# Patient Record
Sex: Female | Born: 1995 | Race: White | Hispanic: No | Marital: Single | Smoking: Never smoker
Health system: Southern US, Community
[De-identification: ages and names within clinical notes are randomized; demographics above are authoritative.]

## PROBLEM LIST (undated history)

## (undated) DIAGNOSIS — L0291 Cutaneous abscess, unspecified: Secondary | ICD-10-CM

## (undated) HISTORY — PX: APPENDECTOMY: SHX54

## (undated) HISTORY — PX: NO PAST SURGERIES: SHX2092

---
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
## 2015-05-07 IMAGING — CT CT ABD-PELV W/ CM
2 of 4 series · 16 of 46 positions shown, 18 images · IV contrast (Omni 300)
Comparison: None.

CLINICAL DATA: Right lower quadrant pain.

EXAM:
CT ABDOMEN AND PELVIS WITH CONTRAST
TECHNIQUE: Multidetector CT imaging of the abdomen and pelvis was performed
using the standard protocol following bolus administration of
intravenous contrast.
CONTRAST:  100mL OMNIPAQUE IOHEXOL 300 MG/ML  SOLN

[Series 2: coronals · coronal · 0.70mm/px · 3 of 119 slices shown]
[im 40/119  soft-tissue]
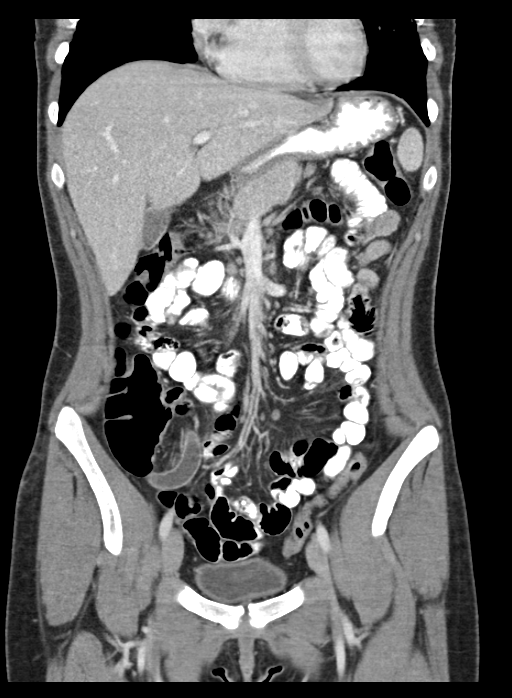
[im 53/119  soft-tissue]
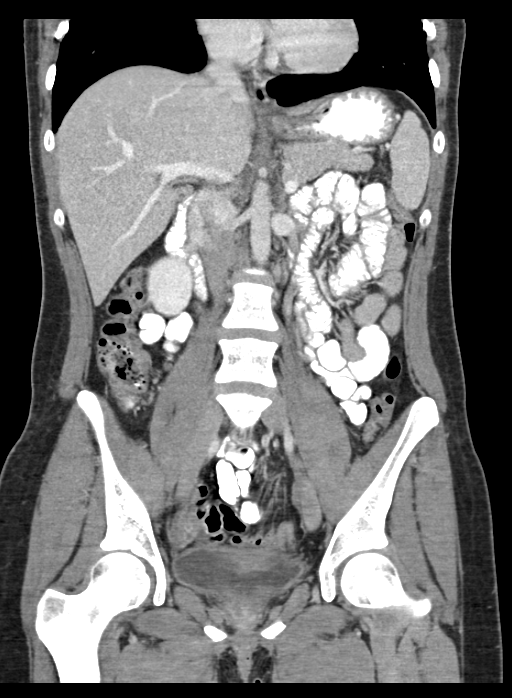
[im 66/119  soft-tissue]
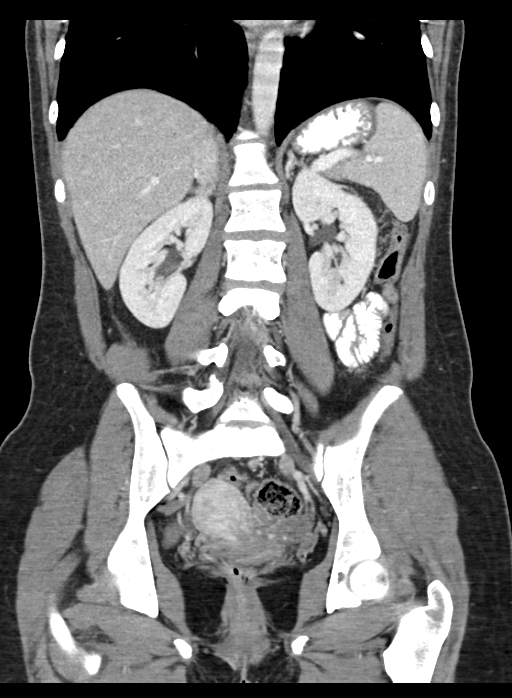

[Series 4: abd/ pelvis 5.0 i30f 1 · axial · 0.70mm/px · z∈[-454,-24]mm · 13 of 94 slices shown, 15 images]
[im 4/94  soft-tissue]
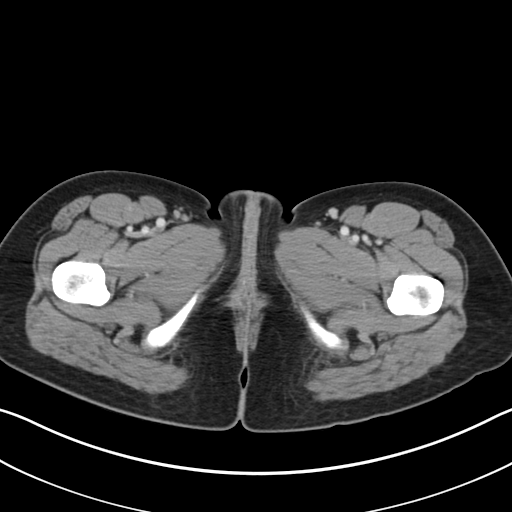
[im 4/94  bone]
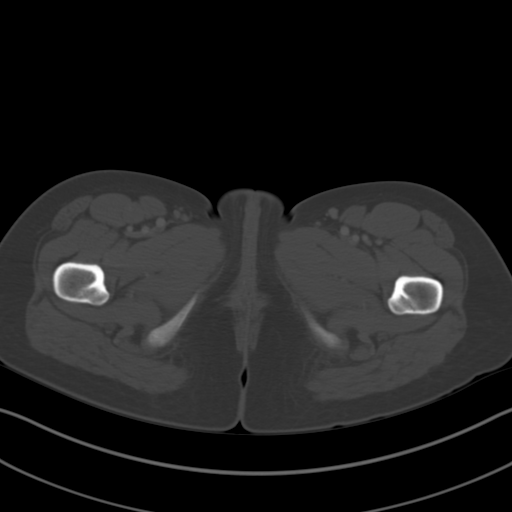
[im 12/94  soft-tissue]
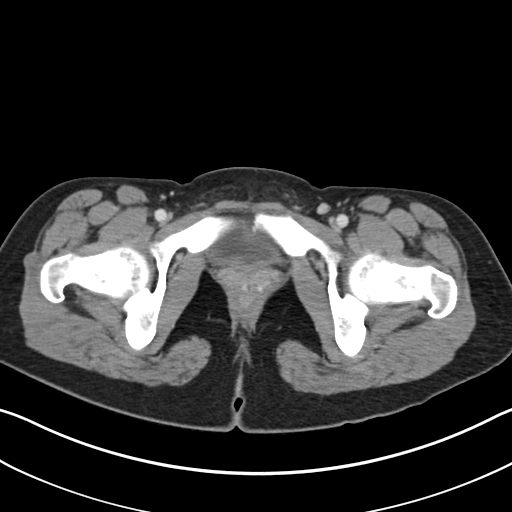
[im 20/94  soft-tissue]
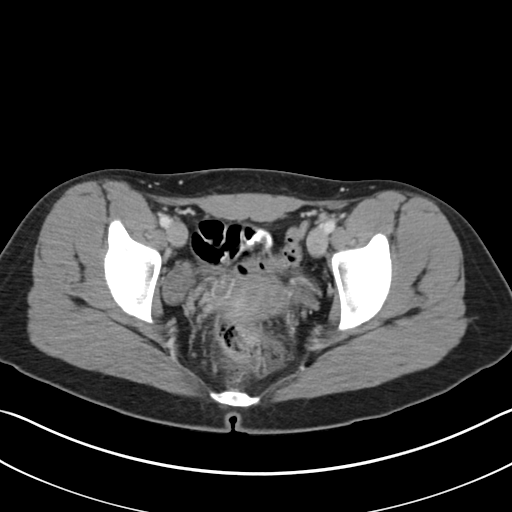
[im 28/94  soft-tissue]
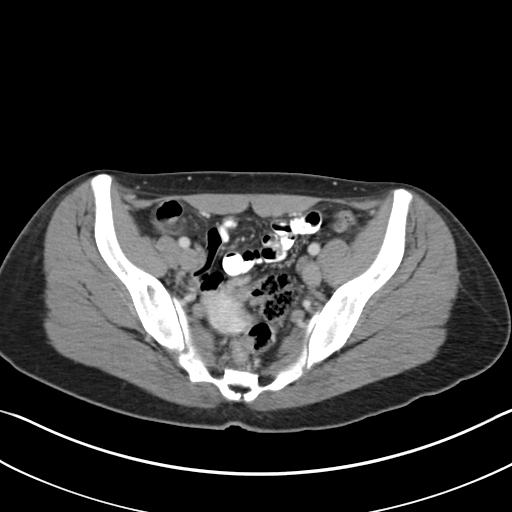
[im 32/94  soft-tissue]
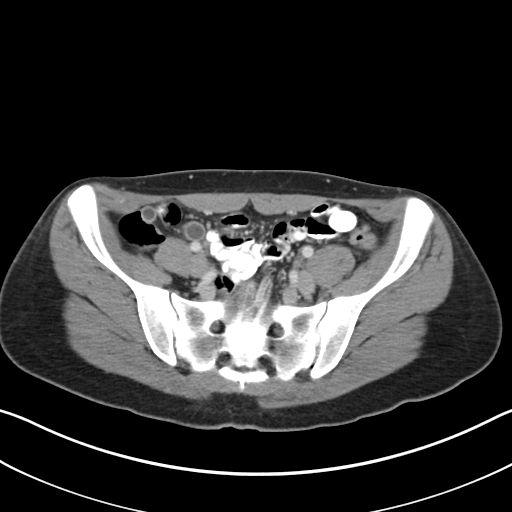
[im 39/94  soft-tissue]
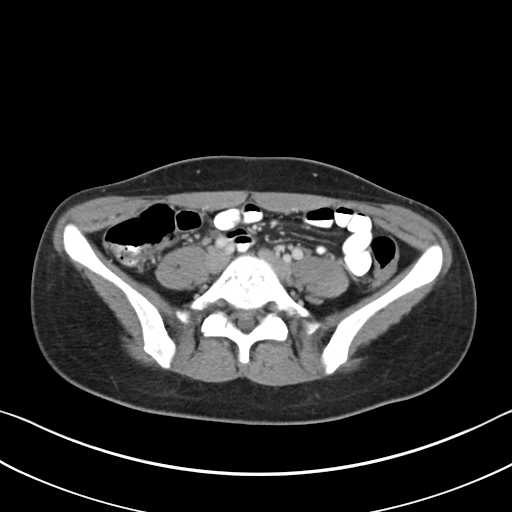
[im 47/94  soft-tissue]
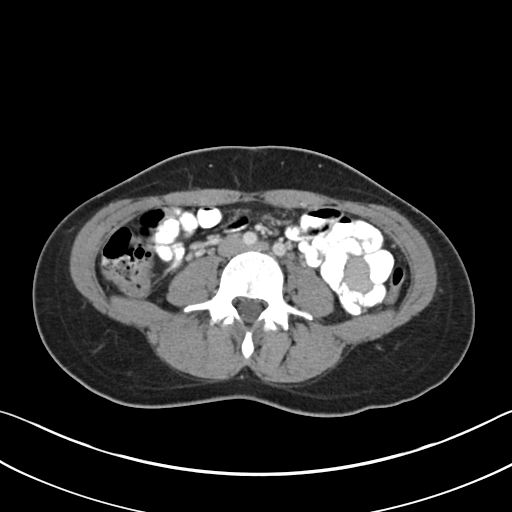
[im 55/94  soft-tissue]
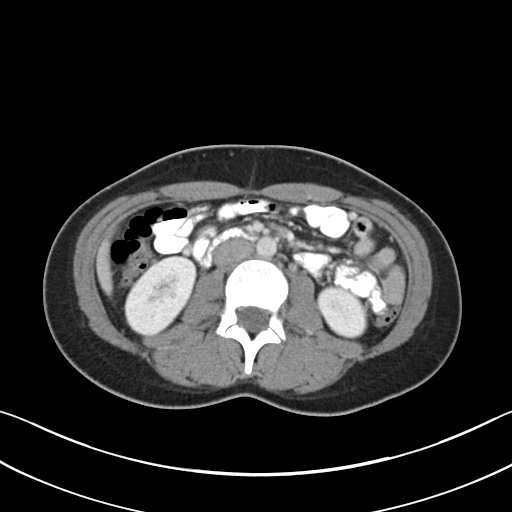
[im 63/94  soft-tissue]
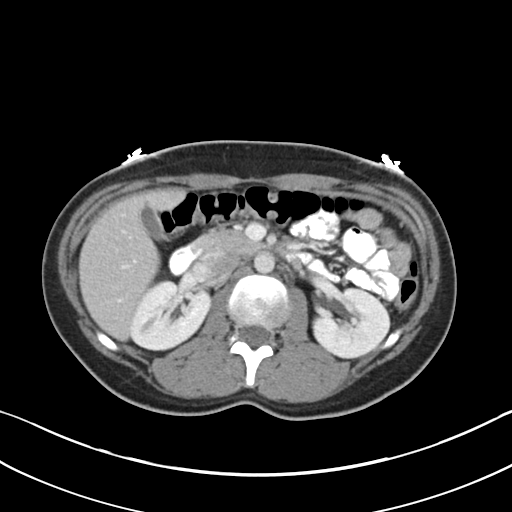
[im 63/94  bone]
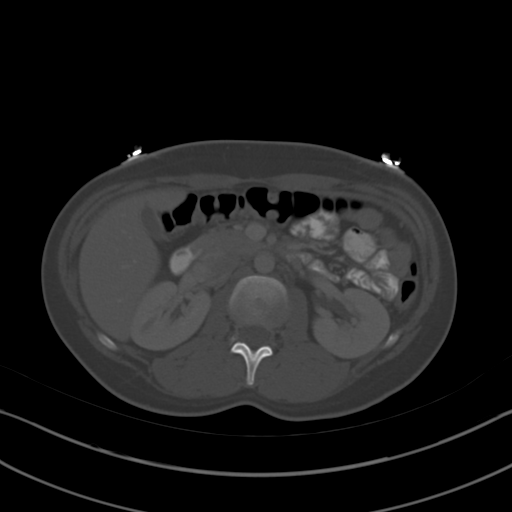
[im 66/94  soft-tissue]
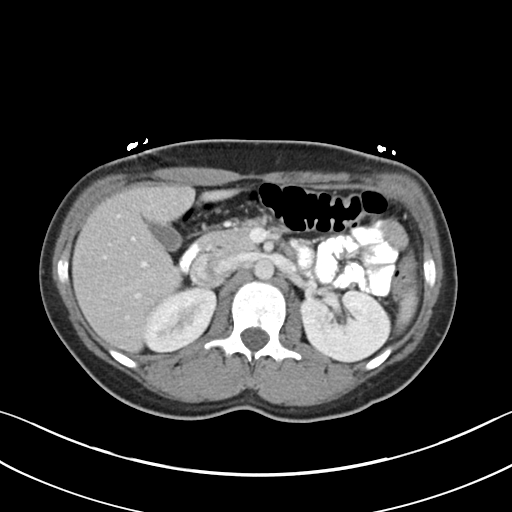
[im 74/94  soft-tissue]
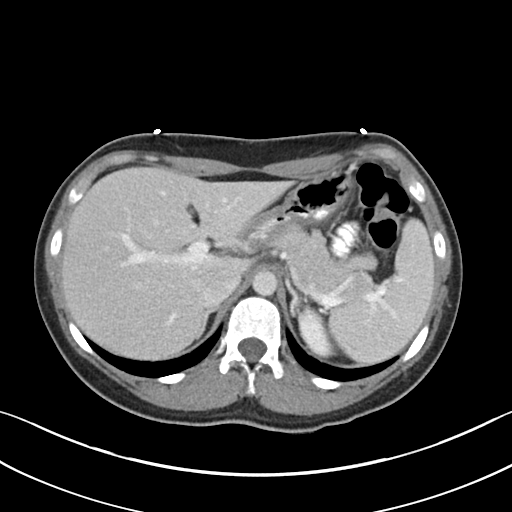
[im 82/94  soft-tissue]
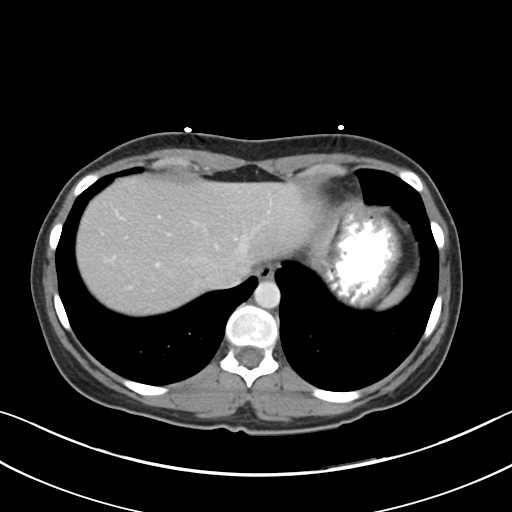
[im 90/94  soft-tissue]
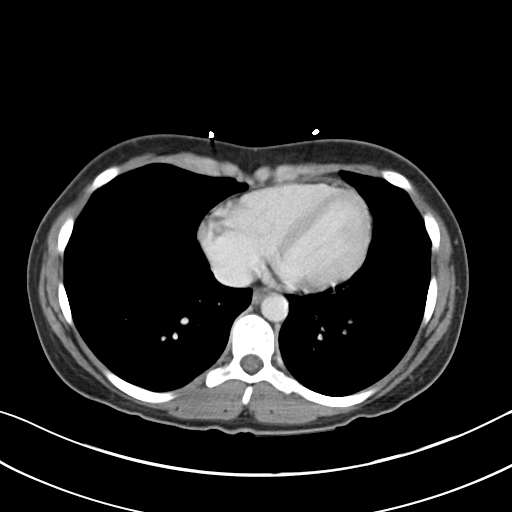

[16 of 46 positions shown; findings below may reference images not displayed]

FINDINGS: Lower chest: Lung bases show no acute findings. Heart size normal.
No pericardial or pleural effusion.

Hepatobiliary: Liver and gallbladder are unremarkable. No biliary
ductal dilatation.

Pancreas: Negative.

Spleen: Negative.

Adrenals/Urinary Tract: Adrenal glands and kidneys are unremarkable.
Ureters are decompressed. Bladder is unremarkable.

Stomach/Bowel: Stomach and small bowel are unremarkable. Appendix is
fluid-filled and dilated, measuring up to 1.4 cm. The wall is not
thickened and there are no periappendiceal inflammatory changes.
Colon is unremarkable.

Vascular/Lymphatic: Vascular structures are unremarkable. Ileocolic
mesenteric lymph nodes measure up to 8 mm in short axis.

Reproductive: Uterus and ovaries are visualized.

Other: No free fluid. Mesenteries and peritoneum are otherwise
unremarkable.

Musculoskeletal: No worrisome lytic or sclerotic lesions.
IMPRESSION: Dilated, fluid-filled and thin-walled appendix, without
periappendiceal inflammatory changes. Findings may be due to
early/mild appendicitis. Mucocele cannot be excluded.

## 2020-10-31 ENCOUNTER — Ambulatory Visit
Admission: RE | Admit: 2020-10-31 | Discharge: 2020-10-31 | Disposition: A | Discharge status: Home / Self Care | Payer: Medicaid Other | Source: Ambulatory Visit | Attending: Internal Medicine | Admitting: Internal Medicine

## 2020-10-31 ENCOUNTER — Other Ambulatory Visit: Payer: Self-pay

## 2020-10-31 VITALS — BP 128/84 | HR 76 | Temp 97.9°F | Resp 18

## 2020-10-31 DIAGNOSIS — B029 Zoster without complications: Secondary | ICD-10-CM

## 2020-10-31 MED ORDER — VALACYCLOVIR HCL 1 G PO TABS: 1000.0000 mg | ORAL_TABLET | Freq: Three times a day (TID) | ORAL | 0 refills | Status: AC

## 2020-10-31 NOTE — Discharge Instructions (Signed)
Please take medications as prescribed If you have any redness of your eye, or eye pain please return to urgent care to be reevaluated.

## 2020-10-31 NOTE — ED Triage Notes (Signed)
Onset yesterday of pruritic lesion on forehead. Has tried witch hazel with some relief. Pt reports that her boyfriend HPV and is concerned if the lesion is similar.

## 2020-10-31 NOTE — ED Provider Notes (Signed)
[facility]    CSN: [id number] Arrival date & time: 10/31/20  1242      History   Chief Complaint Chief Complaint  Patient presents with   1pm appointment    facial lesion    forehead    HPI Erin Walter is a 25 y.o. female comes to the urgent care with achy rash on the left frontal scalp.  Patient's symptoms started yesterday.  It initially started as an achy pain over the left forehead followed by rash with some burning and itching sensation.  She is observed a few more rashes in the same area.  Patient had chickenpox as a child.  No febrile episodes.  No nausea or vomiting.  No redness of her eyes.  No eye discharge or redness. Marland Kitchen   HPI  Past Medical History:  Diagnosis Date   Abscess     Patient Active Problem List   Diagnosis Date Noted   S/P laparoscopic appendectomy Dec 2015 01/14/2014   Irregular menses 10/02/2011   Bilateral lower abdominal pain 09/30/2011    Past Surgical History:  Procedure Laterality Date   APPENDECTOMY     LAPAROSCOPIC APPENDECTOMY N/A 01/13/2014   Procedure: APPENDECTOMY LAPAROSCOPIC;  Surgeon: Violeta Gelinas, MD;  Location: [facility] OR;  Service: General;  Laterality: N/A;   NO PAST SURGERIES      OB History   No obstetric history on file.      Home Medications    Prior to Admission medications   Medication Sig Start Date End Date Taking? Authorizing Provider  valACYclovir (VALTREX) 1000 MG tablet Take 1 tablet (1,000 mg total) by mouth 3 (three) times daily for 7 days. 10/31/20 11/07/20 Yes Harinder Romas, Britta Mccreedy, MD  HAILEY 24 FE 1-20 MG-MCG(24) tablet Take 1 tablet by mouth daily. 09/18/20   [provider]    Family History Family History  Problem Relation Age of Onset   Cholelithiasis Maternal Grandmother     Social History Social History   Tobacco Use   Smoking status: Never   Smokeless tobacco: Never  Substance Use Topics   Alcohol use: No   Drug use: Yes    Types: Marijuana     Allergies    Azithromycin and Omeprazole   Review of Systems Review of Systems  Constitutional: Negative.   Eyes:  Positive for redness. Negative for photophobia, pain, discharge, itching and visual disturbance.  Respiratory: Negative.    Gastrointestinal: Negative.   Genitourinary: Negative.   Neurological: Negative.     Physical Exam Triage Vital Signs ED Triage Vitals  Enc Vitals Group     BP 10/31/20 1303 128/84     Pulse Rate 10/31/20 1303 76     Resp 10/31/20 1303 18     Temp 10/31/20 1303 97.9 F (36.6 C)     Temp Source 10/31/20 1303 Oral     SpO2 10/31/20 1303 98 %     Weight --      Height --      Head Circumference --      Peak Flow --      Pain Score 10/31/20 1305 0     Pain Loc --      Pain Edu? --      Excl. in GC? --    No data found.  Updated Vital Signs BP 128/84 (BP Location: Right Arm)   Pulse 76   Temp 97.9 F (36.6 C) (Oral)   Resp 18   LMP 10/26/2020 (Exact Date)   SpO2  98%   Visual Acuity Right Eye Distance:   Left Eye Distance:   Bilateral Distance:    Right Eye Near:   Left Eye Near:    Bilateral Near:     Physical Exam Skin:    General: Skin is warm.     Findings: Rash present.     Comments: Nonerythematous vesicular rash over the left side of the forehead.  Few vesicles noted.     UC Treatments / Results  Labs (all labs ordered are listed, but only abnormal results are displayed) Labs Reviewed - No data to display  EKG   Radiology No results found.  Procedures Procedures (including critical care time)  Medications Ordered in UC Medications - No data to display  Initial Impression / Assessment and Plan / UC Course  I have reviewed the triage vital signs and the nursing notes.  Pertinent labs & imaging results that were available during my care of the patient were reviewed by me and considered in my medical decision making (see chart for details).     1.  Herpes zoster infection: Valacyclovir 1000 mg 3 times daily for  7 days If you have any eye symptoms please return to urgent care immediately Over-the-counter anti-itch cream  Final Clinical Impressions(s) / UC Diagnoses   Final diagnoses:  Herpes zoster without complication     Discharge Instructions      Please take medications as prescribed If you have any redness of your eye, or eye pain please return to urgent care to be reevaluated.   ED Prescriptions     Medication Sig Dispense Auth. Provider   valACYclovir (VALTREX) 1000 MG tablet Take 1 tablet (1,000 mg total) by mouth 3 (three) times daily for 7 days. 21 tablet Fannie Gathright, Britta Mccreedy, MD      PDMP not reviewed this encounter.   Merrilee Jansky, MD 10/31/20 1401

## 2023-10-03 ENCOUNTER — Other Ambulatory Visit: Payer: Self-pay

## 2023-10-03 ENCOUNTER — Emergency Department (HOSPITAL_COMMUNITY)
Admission: EM | Admit: 2023-10-03 | Discharge: 2023-10-03 | Disposition: A | Discharge status: Home / Self Care | Attending: Emergency Medicine | Admitting: Emergency Medicine

## 2023-10-03 ENCOUNTER — Encounter (HOSPITAL_COMMUNITY): Payer: Self-pay | Admitting: *Deleted

## 2023-10-03 DIAGNOSIS — D72829 Elevated white blood cell count, unspecified: Secondary | ICD-10-CM | POA: Insufficient documentation

## 2023-10-03 DIAGNOSIS — R112 Nausea with vomiting, unspecified: Secondary | ICD-10-CM | POA: Diagnosis present

## 2023-10-03 DIAGNOSIS — K529 Noninfective gastroenteritis and colitis, unspecified: Secondary | ICD-10-CM | POA: Insufficient documentation

## 2023-10-03 LAB — I-STAT CHEM 8, ED
BUN: 13 mg/dL (ref 6–20)
Calcium, Ion: 1.12 mmol/L — ABNORMAL LOW (ref 1.15–1.40)
Chloride: 106 mmol/L (ref 98–111)
Creatinine, Ser: 0.9 mg/dL (ref 0.44–1.00)
Glucose, Bld: 121 mg/dL — ABNORMAL HIGH (ref 70–99)
HCT: 45 % (ref 36.0–46.0)
Hemoglobin: 15.3 g/dL — ABNORMAL HIGH (ref 12.0–15.0)
Potassium: 3.7 mmol/L (ref 3.5–5.1)
Sodium: 140 mmol/L (ref 135–145)
TCO2: 20 mmol/L — ABNORMAL LOW (ref 22–32)

## 2023-10-03 LAB — CBC
HCT: 45 % (ref 36.0–46.0)
Hemoglobin: 14.8 g/dL (ref 12.0–15.0)
MCH: 30 pg (ref 26.0–34.0)
MCHC: 32.9 g/dL (ref 30.0–36.0)
MCV: 91.1 fL (ref 80.0–100.0)
Platelets: 235 K/uL (ref 150–400)
RBC: 4.94 MIL/uL (ref 3.87–5.11)
RDW: 12.1 % (ref 11.5–15.5)
WBC: 11.7 K/uL — ABNORMAL HIGH (ref 4.0–10.5)
nRBC: 0 % (ref 0.0–0.2)

## 2023-10-03 LAB — URINALYSIS, ROUTINE W REFLEX MICROSCOPIC
Bilirubin Urine: NEGATIVE
Glucose, UA: NEGATIVE mg/dL
Hgb urine dipstick: NEGATIVE
Ketones, ur: 5 mg/dL — AB
Leukocytes,Ua: NEGATIVE
Nitrite: NEGATIVE
Protein, ur: NEGATIVE mg/dL
Specific Gravity, Urine: 1.018 (ref 1.005–1.030)
pH: 5 (ref 5.0–8.0)

## 2023-10-03 LAB — COMPREHENSIVE METABOLIC PANEL WITH GFR
ALT: 24 U/L (ref 0–44)
AST: 24 U/L (ref 15–41)
Albumin: 4.5 g/dL (ref 3.5–5.0)
Alkaline Phosphatase: 89 U/L (ref 38–126)
Anion gap: 15 (ref 5–15)
BUN: 13 mg/dL (ref 6–20)
CO2: 20 mmol/L — ABNORMAL LOW (ref 22–32)
Calcium: 9.9 mg/dL (ref 8.9–10.3)
Chloride: 103 mmol/L (ref 98–111)
Creatinine, Ser: 0.79 mg/dL (ref 0.44–1.00)
GFR, Estimated: >60 mL/min (ref >60)
Glucose, Bld: 117 mg/dL — ABNORMAL HIGH (ref 70–99)
Potassium: 3.7 mmol/L (ref 3.5–5.1)
Sodium: 138 mmol/L (ref 135–145)
Total Bilirubin: 1.2 mg/dL (ref 0.0–1.2)
Total Protein: 8 g/dL (ref 6.5–8.1)

## 2023-10-03 LAB — LIPASE, BLOOD: Lipase: 28 U/L (ref 11–51)

## 2023-10-03 LAB — HCG, SERUM, QUALITATIVE: Preg, Serum: NEGATIVE

## 2023-10-03 MED ORDER — METOCLOPRAMIDE HCL 5 MG/ML IJ SOLN
5.0000 mg | Freq: Once | INTRAMUSCULAR | Status: AC
  Administered 2023-10-03: 5 mg via INTRAVENOUS
  Filled 2023-10-03: qty 2

## 2023-10-03 MED ORDER — ONDANSETRON 4 MG PO TBDP
4.0000 mg | ORAL_TABLET | Freq: Once | ORAL | Status: AC | PRN
  Administered 2023-10-03: 4 mg via ORAL
  Filled 2023-10-03: qty 1

## 2023-10-03 MED ORDER — SODIUM CHLORIDE 0.9 % IV BOLUS
1000.0000 mL | Freq: Once | INTRAVENOUS | Status: AC
  Administered 2023-10-03: 1000 mL via INTRAVENOUS

## 2023-10-03 MED ORDER — ONDANSETRON 4 MG PO TBDP
4.0000 mg | ORAL_TABLET | Freq: Three times a day (TID) | ORAL | 0 refills | Status: AC | PRN
Start: 2023-10-03 — End: ?

## 2023-10-03 NOTE — ED Triage Notes (Signed)
 Pt here from home with c/o gen abd  pain and n/v that started approx 2 hours ago

## 2023-10-03 NOTE — ED Notes (Signed)
 Pt leaving for home no new onset distress at this time. Pt is leaving with husband. Paper work revived with pt no questions.

## 2023-10-03 NOTE — ED Provider Notes (Signed)
 [place] [facility] Provider Note   CSN: [id number] Arrival date & time: 10/03/23  1553     Patient presents with: Emesis   Erin Walter is a 28 y.o. female patient status post appendectomy 2015 presenting to emergency room with complaint of generalized abdominal pain and nausea associated with nonbilious nonbloody vomit.  On arrival she is hemodynamically stable and she is well-appearing.  She was given Zofran  in triage which she reports has mostly resolved her symptoms.  She notes suspicious food intake last night.  No travel out of the country.  No loose stools.  No recent antibiotic use. No longer having abdominal pain.     Emesis      Prior to Admission medications   Medication Sig Start Date End Date Taking? Authorizing Provider  HAILEY 24 FE 1-20 MG-MCG(24) tablet Take 1 tablet by mouth daily. 09/18/20   [provider]    Allergies: Azithromycin and Omeprazole     Review of Systems  Gastrointestinal:  Positive for vomiting.    Updated Vital Signs Ht 5' 3 (1.6 m)   Wt 59.9 kg   BMI 23.39 kg/m   Physical Exam Vitals and nursing note reviewed.  Constitutional:      General: She is not in acute distress.    Appearance: She is not toxic-appearing.  HENT:     Head: Normocephalic and atraumatic.  Eyes:     General: No scleral icterus.    Conjunctiva/sclera: Conjunctivae normal.  Cardiovascular:     Rate and Rhythm: Normal rate and regular rhythm.     Pulses: Normal pulses.     Heart sounds: Normal heart sounds.  Pulmonary:     Effort: Pulmonary effort is normal. No respiratory distress.     Breath sounds: Normal breath sounds.  Abdominal:     General: Abdomen is flat. Bowel sounds are normal.     Palpations: Abdomen is soft.     Tenderness: There is no abdominal tenderness.     Comments: Abdomen is soft, nontender nondistended  Musculoskeletal:     Right lower leg: No edema.     Left lower leg: No edema.   Skin:    General: Skin is warm and dry.     Findings: No lesion.  Neurological:     General: No focal deficit present.     Mental Status: She is alert and oriented to person, place, and time. Mental status is at baseline.     (all labs ordered are listed, but only abnormal results are displayed) Labs Reviewed  COMPREHENSIVE METABOLIC PANEL WITH GFR - Abnormal; Notable for the following components:      Result Value   CO2 20 (*)    Glucose, Bld 117 (*)    All other components within normal limits  CBC - Abnormal; Notable for the following components:   WBC 11.7 (*)    All other components within normal limits  URINALYSIS, ROUTINE W REFLEX MICROSCOPIC - Abnormal; Notable for the following components:   APPearance HAZY (*)    Ketones, ur 5 (*)    All other components within normal limits  I-STAT CHEM 8, ED - Abnormal; Notable for the following components:   Glucose, Bld 121 (*)    Calcium, Ion 1.12 (*)    TCO2 20 (*)    Hemoglobin 15.3 (*)    All other components within normal limits  LIPASE, BLOOD  HCG, SERUM, QUALITATIVE    EKG: None  Radiology: No results found.  Procedures   Medications Ordered in the ED  sodium chloride  0.9 % bolus 1,000 mL (has no administration in time range)  metoCLOPramide  (REGLAN ) injection 5 mg (has no administration in time range)  ondansetron  (ZOFRAN -ODT) disintegrating tablet 4 mg (4 mg Oral Given 10/03/23 1607)                                    Medical Decision Making Risk Prescription drug management.   This patient presents to the ED for concern of abdominal pain, this involves an extensive number of treatment options, and is a complaint that carries with it a high risk of complications and morbidity.  The differential diagnosis includes cholecystitis, AAA, appendicitis, renal stone, UTI   Co morbidities that complicate the patient evaluation  History of appendectomy.   Lab Tests:  I personally interpreted labs.  The  pertinent results include:   Mild leukocytosis at 11.7.  Hemoglobin stable.  She has no elevation in LFTs.  Pregnancy test is negative.   Imaging Studies ordered:  Considered however patient is non tender of exam.    Cardiac Monitoring: / EKG:  The patient was maintained on a cardiac monitor.   Problem List / ED Course / Critical interventions / Medication management  Patient reports to emergency room with complaint of generalized abdominal pain associate with nausea vomiting and 2 episodes of loose stool.  She does reports of suspicious food intake.  She is hemodynamically stable and afebrile.  On my exam abdomen is soft and nontender.  Her labs are overall reassuring although she does have a mild leukocytosis.  She has no evidence of urinary tract infection.  Symptoms do seem most consistent with a gastroenteritis.  She has passed p.o. challenge.  Given she is feeling much better feels she is appropriate for discharge with outpatient follow-up. I ordered medication including Reglan , normal Reevaluation of the patient after these medicines showed that the patient improved       Final diagnoses:  Gastroenteritis    ED Discharge Orders          Ordered    ondansetron  (ZOFRAN -ODT) 4 MG disintegrating tablet  Every 8 hours PRN        10/03/23 1725               Jowan Skillin, Warren SAILOR, PA-C 10/03/23 1818    Dean Clarity, MD 10/03/23 [phone]

## 2023-10-03 NOTE — Discharge Instructions (Addendum)
 Please follow-up with family doctor.  Take Zofran  as needed for nausea vomiting.  Make sure staying well-hydrated with primarily water you can alternate Pedialyte and Gatorade.  Return to ER with new or worsening symptoms
